# Patient Record
Sex: Male | Born: 1945 | Race: White | Hispanic: No | Marital: Married | Smoking: Never smoker
Health system: Southern US, Community
[De-identification: ages and names within clinical notes are randomized; demographics above are authoritative.]

## PROBLEM LIST (undated history)

## (undated) DIAGNOSIS — E079 Disorder of thyroid, unspecified: Secondary | ICD-10-CM

## (undated) DIAGNOSIS — E7800 Pure hypercholesterolemia, unspecified: Secondary | ICD-10-CM

---

## 2013-02-09 DIAGNOSIS — E782 Mixed hyperlipidemia: Secondary | ICD-10-CM | POA: Diagnosis not present

## 2013-02-09 DIAGNOSIS — Z0000 Encounter for general adult medical examination without abnormal findings: Secondary | ICD-10-CM | POA: Diagnosis not present

## 2013-08-07 DIAGNOSIS — D485 Neoplasm of uncertain behavior of skin: Secondary | ICD-10-CM | POA: Diagnosis not present

## 2013-08-07 DIAGNOSIS — D235 Other benign neoplasm of skin of trunk: Secondary | ICD-10-CM | POA: Diagnosis not present

## 2013-08-07 DIAGNOSIS — L820 Inflamed seborrheic keratosis: Secondary | ICD-10-CM | POA: Diagnosis not present

## 2013-10-19 DIAGNOSIS — Z125 Encounter for screening for malignant neoplasm of prostate: Secondary | ICD-10-CM | POA: Diagnosis not present

## 2013-10-19 DIAGNOSIS — Z0000 Encounter for general adult medical examination without abnormal findings: Secondary | ICD-10-CM | POA: Diagnosis not present

## 2013-10-19 DIAGNOSIS — N400 Enlarged prostate without lower urinary tract symptoms: Secondary | ICD-10-CM | POA: Diagnosis not present

## 2013-10-19 DIAGNOSIS — R7301 Impaired fasting glucose: Secondary | ICD-10-CM | POA: Diagnosis not present

## 2013-10-19 DIAGNOSIS — Z79899 Other long term (current) drug therapy: Secondary | ICD-10-CM | POA: Diagnosis not present

## 2013-10-19 DIAGNOSIS — E782 Mixed hyperlipidemia: Secondary | ICD-10-CM | POA: Diagnosis not present

## 2013-10-19 DIAGNOSIS — E039 Hypothyroidism, unspecified: Secondary | ICD-10-CM | POA: Diagnosis not present

## 2013-10-19 DIAGNOSIS — Z1331 Encounter for screening for depression: Secondary | ICD-10-CM | POA: Diagnosis not present

## 2013-10-19 DIAGNOSIS — Z23 Encounter for immunization: Secondary | ICD-10-CM | POA: Diagnosis not present

## 2013-11-14 DIAGNOSIS — H43819 Vitreous degeneration, unspecified eye: Secondary | ICD-10-CM | POA: Diagnosis not present

## 2013-11-14 DIAGNOSIS — H35379 Puckering of macula, unspecified eye: Secondary | ICD-10-CM | POA: Diagnosis not present

## 2013-11-14 DIAGNOSIS — D3190 Benign neoplasm of unspecified part of unspecified eye: Secondary | ICD-10-CM | POA: Diagnosis not present

## 2013-11-14 DIAGNOSIS — H43399 Other vitreous opacities, unspecified eye: Secondary | ICD-10-CM | POA: Diagnosis not present

## 2013-11-14 DIAGNOSIS — H2510 Age-related nuclear cataract, unspecified eye: Secondary | ICD-10-CM | POA: Diagnosis not present

## 2013-11-14 DIAGNOSIS — G43809 Other migraine, not intractable, without status migrainosus: Secondary | ICD-10-CM | POA: Diagnosis not present

## 2014-11-06 DIAGNOSIS — H35372 Puckering of macula, left eye: Secondary | ICD-10-CM | POA: Diagnosis not present

## 2014-11-06 DIAGNOSIS — H25013 Cortical age-related cataract, bilateral: Secondary | ICD-10-CM | POA: Diagnosis not present

## 2014-11-06 DIAGNOSIS — H2513 Age-related nuclear cataract, bilateral: Secondary | ICD-10-CM | POA: Diagnosis not present

## 2014-11-06 DIAGNOSIS — D3142 Benign neoplasm of left ciliary body: Secondary | ICD-10-CM | POA: Diagnosis not present

## 2014-11-06 DIAGNOSIS — G43809 Other migraine, not intractable, without status migrainosus: Secondary | ICD-10-CM | POA: Diagnosis not present

## 2014-11-14 DIAGNOSIS — Z79899 Other long term (current) drug therapy: Secondary | ICD-10-CM | POA: Diagnosis not present

## 2014-11-14 DIAGNOSIS — Z23 Encounter for immunization: Secondary | ICD-10-CM | POA: Diagnosis not present

## 2014-11-14 DIAGNOSIS — N529 Male erectile dysfunction, unspecified: Secondary | ICD-10-CM | POA: Diagnosis not present

## 2014-11-14 DIAGNOSIS — Z125 Encounter for screening for malignant neoplasm of prostate: Secondary | ICD-10-CM | POA: Diagnosis not present

## 2014-11-14 DIAGNOSIS — E039 Hypothyroidism, unspecified: Secondary | ICD-10-CM | POA: Diagnosis not present

## 2014-11-14 DIAGNOSIS — R8290 Unspecified abnormal findings in urine: Secondary | ICD-10-CM | POA: Diagnosis not present

## 2014-11-14 DIAGNOSIS — R7301 Impaired fasting glucose: Secondary | ICD-10-CM | POA: Diagnosis not present

## 2014-11-14 DIAGNOSIS — E782 Mixed hyperlipidemia: Secondary | ICD-10-CM | POA: Diagnosis not present

## 2014-11-14 DIAGNOSIS — Z0000 Encounter for general adult medical examination without abnormal findings: Secondary | ICD-10-CM | POA: Diagnosis not present

## 2015-02-06 DIAGNOSIS — E039 Hypothyroidism, unspecified: Secondary | ICD-10-CM | POA: Diagnosis not present

## 2015-02-06 DIAGNOSIS — Z23 Encounter for immunization: Secondary | ICD-10-CM | POA: Diagnosis not present

## 2015-02-06 DIAGNOSIS — R7301 Impaired fasting glucose: Secondary | ICD-10-CM | POA: Diagnosis not present

## 2015-03-06 DIAGNOSIS — Z1283 Encounter for screening for malignant neoplasm of skin: Secondary | ICD-10-CM | POA: Diagnosis not present

## 2015-03-06 DIAGNOSIS — L820 Inflamed seborrheic keratosis: Secondary | ICD-10-CM | POA: Diagnosis not present

## 2015-11-12 DIAGNOSIS — G43809 Other migraine, not intractable, without status migrainosus: Secondary | ICD-10-CM | POA: Diagnosis not present

## 2015-11-12 DIAGNOSIS — H35372 Puckering of macula, left eye: Secondary | ICD-10-CM | POA: Diagnosis not present

## 2015-11-12 DIAGNOSIS — H35371 Puckering of macula, right eye: Secondary | ICD-10-CM | POA: Diagnosis not present

## 2015-11-12 DIAGNOSIS — H25013 Cortical age-related cataract, bilateral: Secondary | ICD-10-CM | POA: Diagnosis not present

## 2015-11-12 DIAGNOSIS — H2513 Age-related nuclear cataract, bilateral: Secondary | ICD-10-CM | POA: Diagnosis not present

## 2015-11-12 DIAGNOSIS — D3142 Benign neoplasm of left ciliary body: Secondary | ICD-10-CM | POA: Diagnosis not present

## 2015-11-25 DIAGNOSIS — Z125 Encounter for screening for malignant neoplasm of prostate: Secondary | ICD-10-CM | POA: Diagnosis not present

## 2015-11-25 DIAGNOSIS — R7301 Impaired fasting glucose: Secondary | ICD-10-CM | POA: Diagnosis not present

## 2015-11-25 DIAGNOSIS — E782 Mixed hyperlipidemia: Secondary | ICD-10-CM | POA: Diagnosis not present

## 2015-11-25 DIAGNOSIS — Z0000 Encounter for general adult medical examination without abnormal findings: Secondary | ICD-10-CM | POA: Diagnosis not present

## 2015-11-29 DIAGNOSIS — Z23 Encounter for immunization: Secondary | ICD-10-CM | POA: Diagnosis not present

## 2015-11-29 DIAGNOSIS — Z1389 Encounter for screening for other disorder: Secondary | ICD-10-CM | POA: Diagnosis not present

## 2015-11-29 DIAGNOSIS — E039 Hypothyroidism, unspecified: Secondary | ICD-10-CM | POA: Diagnosis not present

## 2015-11-29 DIAGNOSIS — E782 Mixed hyperlipidemia: Secondary | ICD-10-CM | POA: Diagnosis not present

## 2015-11-29 DIAGNOSIS — R8290 Unspecified abnormal findings in urine: Secondary | ICD-10-CM | POA: Diagnosis not present

## 2015-11-29 DIAGNOSIS — Z0000 Encounter for general adult medical examination without abnormal findings: Secondary | ICD-10-CM | POA: Diagnosis not present

## 2015-11-29 DIAGNOSIS — N400 Enlarged prostate without lower urinary tract symptoms: Secondary | ICD-10-CM | POA: Diagnosis not present

## 2015-11-29 DIAGNOSIS — N529 Male erectile dysfunction, unspecified: Secondary | ICD-10-CM | POA: Diagnosis not present

## 2015-11-29 DIAGNOSIS — Z79899 Other long term (current) drug therapy: Secondary | ICD-10-CM | POA: Diagnosis not present

## 2015-11-29 DIAGNOSIS — R7301 Impaired fasting glucose: Secondary | ICD-10-CM | POA: Diagnosis not present

## 2015-11-29 DIAGNOSIS — H6120 Impacted cerumen, unspecified ear: Secondary | ICD-10-CM | POA: Diagnosis not present

## 2016-02-12 DIAGNOSIS — L820 Inflamed seborrheic keratosis: Secondary | ICD-10-CM | POA: Diagnosis not present

## 2016-02-12 DIAGNOSIS — D225 Melanocytic nevi of trunk: Secondary | ICD-10-CM | POA: Diagnosis not present

## 2016-02-12 DIAGNOSIS — Z1283 Encounter for screening for malignant neoplasm of skin: Secondary | ICD-10-CM | POA: Diagnosis not present

## 2016-02-28 DIAGNOSIS — J069 Acute upper respiratory infection, unspecified: Secondary | ICD-10-CM | POA: Diagnosis not present

## 2016-10-20 DIAGNOSIS — Z23 Encounter for immunization: Secondary | ICD-10-CM | POA: Diagnosis not present

## 2016-11-17 DIAGNOSIS — H35033 Hypertensive retinopathy, bilateral: Secondary | ICD-10-CM | POA: Diagnosis not present

## 2016-11-17 DIAGNOSIS — H35373 Puckering of macula, bilateral: Secondary | ICD-10-CM | POA: Diagnosis not present

## 2016-11-17 DIAGNOSIS — H35371 Puckering of macula, right eye: Secondary | ICD-10-CM | POA: Diagnosis not present

## 2016-11-17 DIAGNOSIS — H25013 Cortical age-related cataract, bilateral: Secondary | ICD-10-CM | POA: Diagnosis not present

## 2016-11-17 DIAGNOSIS — H2513 Age-related nuclear cataract, bilateral: Secondary | ICD-10-CM | POA: Diagnosis not present

## 2016-12-21 DIAGNOSIS — J209 Acute bronchitis, unspecified: Secondary | ICD-10-CM | POA: Diagnosis not present

## 2017-01-01 DIAGNOSIS — E782 Mixed hyperlipidemia: Secondary | ICD-10-CM | POA: Diagnosis not present

## 2017-01-01 DIAGNOSIS — N529 Male erectile dysfunction, unspecified: Secondary | ICD-10-CM | POA: Diagnosis not present

## 2017-01-01 DIAGNOSIS — R7301 Impaired fasting glucose: Secondary | ICD-10-CM | POA: Diagnosis not present

## 2017-01-01 DIAGNOSIS — N400 Enlarged prostate without lower urinary tract symptoms: Secondary | ICD-10-CM | POA: Diagnosis not present

## 2017-01-01 DIAGNOSIS — E039 Hypothyroidism, unspecified: Secondary | ICD-10-CM | POA: Diagnosis not present

## 2017-01-01 DIAGNOSIS — Z125 Encounter for screening for malignant neoplasm of prostate: Secondary | ICD-10-CM | POA: Diagnosis not present

## 2017-01-01 DIAGNOSIS — Z79899 Other long term (current) drug therapy: Secondary | ICD-10-CM | POA: Diagnosis not present

## 2017-01-01 DIAGNOSIS — Z0000 Encounter for general adult medical examination without abnormal findings: Secondary | ICD-10-CM | POA: Diagnosis not present

## 2017-02-10 DIAGNOSIS — L820 Inflamed seborrheic keratosis: Secondary | ICD-10-CM | POA: Diagnosis not present

## 2017-02-10 DIAGNOSIS — Z1283 Encounter for screening for malignant neoplasm of skin: Secondary | ICD-10-CM | POA: Diagnosis not present

## 2017-02-10 DIAGNOSIS — L821 Other seborrheic keratosis: Secondary | ICD-10-CM | POA: Diagnosis not present

## 2017-09-01 DIAGNOSIS — Z23 Encounter for immunization: Secondary | ICD-10-CM | POA: Diagnosis not present

## 2017-09-13 DIAGNOSIS — H903 Sensorineural hearing loss, bilateral: Secondary | ICD-10-CM | POA: Diagnosis not present

## 2017-11-23 DIAGNOSIS — H35033 Hypertensive retinopathy, bilateral: Secondary | ICD-10-CM | POA: Diagnosis not present

## 2017-11-23 DIAGNOSIS — H35371 Puckering of macula, right eye: Secondary | ICD-10-CM | POA: Diagnosis not present

## 2017-11-23 DIAGNOSIS — H25013 Cortical age-related cataract, bilateral: Secondary | ICD-10-CM | POA: Diagnosis not present

## 2017-11-23 DIAGNOSIS — H2513 Age-related nuclear cataract, bilateral: Secondary | ICD-10-CM | POA: Diagnosis not present

## 2017-11-30 DIAGNOSIS — R000 Tachycardia, unspecified: Secondary | ICD-10-CM | POA: Diagnosis not present

## 2017-11-30 DIAGNOSIS — J019 Acute sinusitis, unspecified: Secondary | ICD-10-CM | POA: Diagnosis not present

## 2017-11-30 DIAGNOSIS — R05 Cough: Secondary | ICD-10-CM | POA: Diagnosis not present

## 2017-12-31 DIAGNOSIS — E782 Mixed hyperlipidemia: Secondary | ICD-10-CM | POA: Diagnosis not present

## 2017-12-31 DIAGNOSIS — Z79899 Other long term (current) drug therapy: Secondary | ICD-10-CM | POA: Diagnosis not present

## 2017-12-31 DIAGNOSIS — E039 Hypothyroidism, unspecified: Secondary | ICD-10-CM | POA: Diagnosis not present

## 2017-12-31 DIAGNOSIS — Z125 Encounter for screening for malignant neoplasm of prostate: Secondary | ICD-10-CM | POA: Diagnosis not present

## 2018-01-05 DIAGNOSIS — N529 Male erectile dysfunction, unspecified: Secondary | ICD-10-CM | POA: Diagnosis not present

## 2018-01-05 DIAGNOSIS — N400 Enlarged prostate without lower urinary tract symptoms: Secondary | ICD-10-CM | POA: Diagnosis not present

## 2018-01-05 DIAGNOSIS — R8290 Unspecified abnormal findings in urine: Secondary | ICD-10-CM | POA: Diagnosis not present

## 2018-01-05 DIAGNOSIS — E782 Mixed hyperlipidemia: Secondary | ICD-10-CM | POA: Diagnosis not present

## 2018-01-05 DIAGNOSIS — Z79899 Other long term (current) drug therapy: Secondary | ICD-10-CM | POA: Diagnosis not present

## 2018-01-05 DIAGNOSIS — E039 Hypothyroidism, unspecified: Secondary | ICD-10-CM | POA: Diagnosis not present

## 2018-01-05 DIAGNOSIS — Z125 Encounter for screening for malignant neoplasm of prostate: Secondary | ICD-10-CM | POA: Diagnosis not present

## 2018-01-05 DIAGNOSIS — R7301 Impaired fasting glucose: Secondary | ICD-10-CM | POA: Diagnosis not present

## 2018-01-05 DIAGNOSIS — Z0000 Encounter for general adult medical examination without abnormal findings: Secondary | ICD-10-CM | POA: Diagnosis not present

## 2018-02-14 DIAGNOSIS — E782 Mixed hyperlipidemia: Secondary | ICD-10-CM | POA: Diagnosis not present

## 2018-02-14 DIAGNOSIS — H532 Diplopia: Secondary | ICD-10-CM | POA: Diagnosis not present

## 2018-02-14 DIAGNOSIS — R7301 Impaired fasting glucose: Secondary | ICD-10-CM | POA: Diagnosis not present

## 2018-02-15 ENCOUNTER — Other Ambulatory Visit: Payer: Self-pay | Admitting: Family Medicine

## 2018-02-15 DIAGNOSIS — H532 Diplopia: Secondary | ICD-10-CM

## 2018-02-15 DIAGNOSIS — I639 Cerebral infarction, unspecified: Secondary | ICD-10-CM

## 2018-02-15 DIAGNOSIS — G459 Transient cerebral ischemic attack, unspecified: Secondary | ICD-10-CM

## 2018-02-16 DIAGNOSIS — L821 Other seborrheic keratosis: Secondary | ICD-10-CM | POA: Diagnosis not present

## 2018-02-16 DIAGNOSIS — L820 Inflamed seborrheic keratosis: Secondary | ICD-10-CM | POA: Diagnosis not present

## 2018-02-16 DIAGNOSIS — Z1283 Encounter for screening for malignant neoplasm of skin: Secondary | ICD-10-CM | POA: Diagnosis not present

## 2018-02-18 ENCOUNTER — Ambulatory Visit
Admission: RE | Admit: 2018-02-18 | Discharge: 2018-02-18 | Disposition: A | Discharge status: Home / Self Care | Payer: Medicare Other | Source: Ambulatory Visit | Attending: Family Medicine | Admitting: Family Medicine

## 2018-02-18 DIAGNOSIS — H532 Diplopia: Secondary | ICD-10-CM | POA: Diagnosis not present

## 2018-02-18 DIAGNOSIS — I639 Cerebral infarction, unspecified: Secondary | ICD-10-CM

## 2018-02-18 DIAGNOSIS — G459 Transient cerebral ischemic attack, unspecified: Secondary | ICD-10-CM

## 2018-02-21 ENCOUNTER — Other Ambulatory Visit: Payer: Self-pay | Admitting: Family Medicine

## 2018-02-21 DIAGNOSIS — H532 Diplopia: Secondary | ICD-10-CM

## 2018-03-14 DIAGNOSIS — Z1211 Encounter for screening for malignant neoplasm of colon: Secondary | ICD-10-CM | POA: Diagnosis not present

## 2018-05-02 DIAGNOSIS — H903 Sensorineural hearing loss, bilateral: Secondary | ICD-10-CM | POA: Diagnosis not present

## 2018-10-17 DIAGNOSIS — Z23 Encounter for immunization: Secondary | ICD-10-CM | POA: Diagnosis not present

## 2018-12-02 DIAGNOSIS — D3142 Benign neoplasm of left ciliary body: Secondary | ICD-10-CM | POA: Diagnosis not present

## 2018-12-02 DIAGNOSIS — H35372 Puckering of macula, left eye: Secondary | ICD-10-CM | POA: Diagnosis not present

## 2018-12-02 DIAGNOSIS — H2513 Age-related nuclear cataract, bilateral: Secondary | ICD-10-CM | POA: Diagnosis not present

## 2018-12-02 DIAGNOSIS — H35033 Hypertensive retinopathy, bilateral: Secondary | ICD-10-CM | POA: Diagnosis not present

## 2018-12-02 DIAGNOSIS — H25013 Cortical age-related cataract, bilateral: Secondary | ICD-10-CM | POA: Diagnosis not present

## 2018-12-02 DIAGNOSIS — H35371 Puckering of macula, right eye: Secondary | ICD-10-CM | POA: Diagnosis not present

## 2019-01-17 DIAGNOSIS — Z79899 Other long term (current) drug therapy: Secondary | ICD-10-CM | POA: Diagnosis not present

## 2019-01-17 DIAGNOSIS — Z125 Encounter for screening for malignant neoplasm of prostate: Secondary | ICD-10-CM | POA: Diagnosis not present

## 2019-01-17 DIAGNOSIS — Z1159 Encounter for screening for other viral diseases: Secondary | ICD-10-CM | POA: Diagnosis not present

## 2019-01-17 DIAGNOSIS — E782 Mixed hyperlipidemia: Secondary | ICD-10-CM | POA: Diagnosis not present

## 2019-01-17 DIAGNOSIS — R7301 Impaired fasting glucose: Secondary | ICD-10-CM | POA: Diagnosis not present

## 2019-01-20 DIAGNOSIS — G43809 Other migraine, not intractable, without status migrainosus: Secondary | ICD-10-CM | POA: Diagnosis not present

## 2019-01-20 DIAGNOSIS — Z0000 Encounter for general adult medical examination without abnormal findings: Secondary | ICD-10-CM | POA: Diagnosis not present

## 2019-01-20 DIAGNOSIS — R7301 Impaired fasting glucose: Secondary | ICD-10-CM | POA: Diagnosis not present

## 2019-01-20 DIAGNOSIS — N529 Male erectile dysfunction, unspecified: Secondary | ICD-10-CM | POA: Diagnosis not present

## 2019-01-20 DIAGNOSIS — Z1389 Encounter for screening for other disorder: Secondary | ICD-10-CM | POA: Diagnosis not present

## 2019-01-20 DIAGNOSIS — N400 Enlarged prostate without lower urinary tract symptoms: Secondary | ICD-10-CM | POA: Diagnosis not present

## 2019-01-20 DIAGNOSIS — Z79899 Other long term (current) drug therapy: Secondary | ICD-10-CM | POA: Diagnosis not present

## 2019-01-20 DIAGNOSIS — H6120 Impacted cerumen, unspecified ear: Secondary | ICD-10-CM | POA: Diagnosis not present

## 2019-01-20 DIAGNOSIS — E782 Mixed hyperlipidemia: Secondary | ICD-10-CM | POA: Diagnosis not present

## 2019-01-20 DIAGNOSIS — Z125 Encounter for screening for malignant neoplasm of prostate: Secondary | ICD-10-CM | POA: Diagnosis not present

## 2019-01-20 DIAGNOSIS — E039 Hypothyroidism, unspecified: Secondary | ICD-10-CM | POA: Diagnosis not present

## 2019-01-20 DIAGNOSIS — Z1159 Encounter for screening for other viral diseases: Secondary | ICD-10-CM | POA: Diagnosis not present

## 2019-01-20 DIAGNOSIS — R8290 Unspecified abnormal findings in urine: Secondary | ICD-10-CM | POA: Diagnosis not present

## 2019-01-30 DIAGNOSIS — D225 Melanocytic nevi of trunk: Secondary | ICD-10-CM | POA: Diagnosis not present

## 2019-01-30 DIAGNOSIS — Z1283 Encounter for screening for malignant neoplasm of skin: Secondary | ICD-10-CM | POA: Diagnosis not present

## 2019-01-30 DIAGNOSIS — L820 Inflamed seborrheic keratosis: Secondary | ICD-10-CM | POA: Diagnosis not present

## 2019-07-04 DIAGNOSIS — H6120 Impacted cerumen, unspecified ear: Secondary | ICD-10-CM | POA: Diagnosis not present

## 2019-07-04 DIAGNOSIS — R7301 Impaired fasting glucose: Secondary | ICD-10-CM | POA: Diagnosis not present

## 2019-07-04 DIAGNOSIS — Z1159 Encounter for screening for other viral diseases: Secondary | ICD-10-CM | POA: Diagnosis not present

## 2019-07-04 DIAGNOSIS — Z125 Encounter for screening for malignant neoplasm of prostate: Secondary | ICD-10-CM | POA: Diagnosis not present

## 2019-07-04 DIAGNOSIS — E039 Hypothyroidism, unspecified: Secondary | ICD-10-CM | POA: Diagnosis not present

## 2019-07-04 DIAGNOSIS — N529 Male erectile dysfunction, unspecified: Secondary | ICD-10-CM | POA: Diagnosis not present

## 2019-07-04 DIAGNOSIS — Z79899 Other long term (current) drug therapy: Secondary | ICD-10-CM | POA: Diagnosis not present

## 2019-07-04 DIAGNOSIS — N400 Enlarged prostate without lower urinary tract symptoms: Secondary | ICD-10-CM | POA: Diagnosis not present

## 2019-07-04 DIAGNOSIS — R8290 Unspecified abnormal findings in urine: Secondary | ICD-10-CM | POA: Diagnosis not present

## 2019-07-04 DIAGNOSIS — E782 Mixed hyperlipidemia: Secondary | ICD-10-CM | POA: Diagnosis not present

## 2019-07-04 DIAGNOSIS — G43809 Other migraine, not intractable, without status migrainosus: Secondary | ICD-10-CM | POA: Diagnosis not present

## 2019-09-21 DIAGNOSIS — Z23 Encounter for immunization: Secondary | ICD-10-CM | POA: Diagnosis not present

## 2020-01-22 DIAGNOSIS — E782 Mixed hyperlipidemia: Secondary | ICD-10-CM | POA: Diagnosis not present

## 2020-01-22 DIAGNOSIS — E039 Hypothyroidism, unspecified: Secondary | ICD-10-CM | POA: Diagnosis not present

## 2020-01-22 DIAGNOSIS — Z79899 Other long term (current) drug therapy: Secondary | ICD-10-CM | POA: Diagnosis not present

## 2020-01-22 DIAGNOSIS — Z125 Encounter for screening for malignant neoplasm of prostate: Secondary | ICD-10-CM | POA: Diagnosis not present

## 2020-01-22 DIAGNOSIS — R7301 Impaired fasting glucose: Secondary | ICD-10-CM | POA: Diagnosis not present

## 2020-01-25 DIAGNOSIS — N400 Enlarged prostate without lower urinary tract symptoms: Secondary | ICD-10-CM | POA: Diagnosis not present

## 2020-01-25 DIAGNOSIS — R7301 Impaired fasting glucose: Secondary | ICD-10-CM | POA: Diagnosis not present

## 2020-01-25 DIAGNOSIS — Z0000 Encounter for general adult medical examination without abnormal findings: Secondary | ICD-10-CM | POA: Diagnosis not present

## 2020-01-25 DIAGNOSIS — Z79899 Other long term (current) drug therapy: Secondary | ICD-10-CM | POA: Diagnosis not present

## 2020-01-25 DIAGNOSIS — N529 Male erectile dysfunction, unspecified: Secondary | ICD-10-CM | POA: Diagnosis not present

## 2020-01-25 DIAGNOSIS — Z125 Encounter for screening for malignant neoplasm of prostate: Secondary | ICD-10-CM | POA: Diagnosis not present

## 2020-01-25 DIAGNOSIS — E039 Hypothyroidism, unspecified: Secondary | ICD-10-CM | POA: Diagnosis not present

## 2020-01-25 DIAGNOSIS — E782 Mixed hyperlipidemia: Secondary | ICD-10-CM | POA: Diagnosis not present

## 2020-01-31 DIAGNOSIS — L821 Other seborrheic keratosis: Secondary | ICD-10-CM | POA: Diagnosis not present

## 2020-01-31 DIAGNOSIS — Z1283 Encounter for screening for malignant neoplasm of skin: Secondary | ICD-10-CM | POA: Diagnosis not present

## 2020-02-04 ENCOUNTER — Ambulatory Visit: Payer: Medicare Other | Attending: Internal Medicine

## 2020-02-04 DIAGNOSIS — Z23 Encounter for immunization: Secondary | ICD-10-CM

## 2020-02-04 NOTE — Progress Notes (Signed)
   Covid-19 Vaccination Clinic  Name:  Sidhant Kult    MRN: [id number] DOB: [birth year]  02/04/2020  Mr. Barrilleaux was observed post Covid-19 immunization for 15 minutes without incidence. He was provided with Vaccine Information Sheet and instruction to access the V-Safe system.   Mr. Sheets was instructed to call 911 with any severe reactions post vaccine: Marland Kitchen Difficulty breathing  . Swelling of your face and throat  . A fast heartbeat  . A bad rash all over your body  . Dizziness and weakness    Immunizations Administered    Name Date Dose VIS Date Route   Pfizer COVID-19 Vaccine 02/04/2020 11:08 AM 0.3 mL 12/01/2019 Intramuscular   Manufacturer: [place]   Lot: [id number]   [place]: [id number]

## 2020-02-27 ENCOUNTER — Ambulatory Visit: Payer: Medicare Other | Attending: Internal Medicine

## 2020-02-27 DIAGNOSIS — Z23 Encounter for immunization: Secondary | ICD-10-CM | POA: Insufficient documentation

## 2020-02-27 NOTE — Progress Notes (Signed)
   Covid-19 Vaccination Clinic  Name:  Tanner Valenzuela    MRN: [id number] DOB: 07/02/1946  02/27/2020  [name] was observed post Covid-19 immunization for 15 minutes without incident. He was provided with Vaccine Information Sheet and instruction to access the V-Safe system.   [name] was instructed to call 911 with any severe reactions post vaccine: Marland Kitchen Difficulty breathing  . Swelling of face and throat  . A fast heartbeat  . A bad rash all over body  . Dizziness and weakness   Immunizations Administered    Name Date Dose VIS Date Route   Pfizer COVID-19 Vaccine 02/27/2020  1:08 PM 0.3 mL 12/01/2019 Intramuscular   Manufacturer: [place]   Lot: [id number]   [place]: [id number]

## 2020-07-25 DIAGNOSIS — E782 Mixed hyperlipidemia: Secondary | ICD-10-CM | POA: Diagnosis not present

## 2020-07-25 DIAGNOSIS — N400 Enlarged prostate without lower urinary tract symptoms: Secondary | ICD-10-CM | POA: Diagnosis not present

## 2020-07-25 DIAGNOSIS — R7301 Impaired fasting glucose: Secondary | ICD-10-CM | POA: Diagnosis not present

## 2020-07-25 DIAGNOSIS — E039 Hypothyroidism, unspecified: Secondary | ICD-10-CM | POA: Diagnosis not present

## 2020-07-25 DIAGNOSIS — Z0000 Encounter for general adult medical examination without abnormal findings: Secondary | ICD-10-CM | POA: Diagnosis not present

## 2020-07-25 DIAGNOSIS — Z131 Encounter for screening for diabetes mellitus: Secondary | ICD-10-CM | POA: Diagnosis not present

## 2020-07-25 DIAGNOSIS — N529 Male erectile dysfunction, unspecified: Secondary | ICD-10-CM | POA: Diagnosis not present

## 2020-07-25 DIAGNOSIS — Z79899 Other long term (current) drug therapy: Secondary | ICD-10-CM | POA: Diagnosis not present

## 2020-07-25 DIAGNOSIS — Z125 Encounter for screening for malignant neoplasm of prostate: Secondary | ICD-10-CM | POA: Diagnosis not present

## 2020-10-04 DIAGNOSIS — Z23 Encounter for immunization: Secondary | ICD-10-CM | POA: Diagnosis not present

## 2020-12-10 DIAGNOSIS — H35033 Hypertensive retinopathy, bilateral: Secondary | ICD-10-CM | POA: Diagnosis not present

## 2020-12-10 DIAGNOSIS — H25013 Cortical age-related cataract, bilateral: Secondary | ICD-10-CM | POA: Diagnosis not present

## 2020-12-10 DIAGNOSIS — H35373 Puckering of macula, bilateral: Secondary | ICD-10-CM | POA: Diagnosis not present

## 2020-12-10 DIAGNOSIS — H2513 Age-related nuclear cataract, bilateral: Secondary | ICD-10-CM | POA: Diagnosis not present

## 2021-02-05 DIAGNOSIS — D225 Melanocytic nevi of trunk: Secondary | ICD-10-CM | POA: Diagnosis not present

## 2021-02-05 DIAGNOSIS — Z1283 Encounter for screening for malignant neoplasm of skin: Secondary | ICD-10-CM | POA: Diagnosis not present

## 2021-02-05 DIAGNOSIS — L820 Inflamed seborrheic keratosis: Secondary | ICD-10-CM | POA: Diagnosis not present

## 2021-04-10 DIAGNOSIS — Z23 Encounter for immunization: Secondary | ICD-10-CM | POA: Diagnosis not present

## 2021-04-10 DIAGNOSIS — E782 Mixed hyperlipidemia: Secondary | ICD-10-CM | POA: Diagnosis not present

## 2021-04-10 DIAGNOSIS — R7303 Prediabetes: Secondary | ICD-10-CM | POA: Diagnosis not present

## 2021-04-10 DIAGNOSIS — E039 Hypothyroidism, unspecified: Secondary | ICD-10-CM | POA: Diagnosis not present

## 2021-04-10 DIAGNOSIS — Z0000 Encounter for general adult medical examination without abnormal findings: Secondary | ICD-10-CM | POA: Diagnosis not present

## 2021-04-10 DIAGNOSIS — N529 Male erectile dysfunction, unspecified: Secondary | ICD-10-CM | POA: Diagnosis not present

## 2021-09-26 DIAGNOSIS — Z23 Encounter for immunization: Secondary | ICD-10-CM | POA: Diagnosis not present

## 2021-12-11 DIAGNOSIS — D3192 Benign neoplasm of unspecified part of left eye: Secondary | ICD-10-CM | POA: Diagnosis not present

## 2021-12-11 DIAGNOSIS — H35031 Hypertensive retinopathy, right eye: Secondary | ICD-10-CM | POA: Diagnosis not present

## 2021-12-11 DIAGNOSIS — H25813 Combined forms of age-related cataract, bilateral: Secondary | ICD-10-CM | POA: Diagnosis not present

## 2021-12-11 DIAGNOSIS — G43B0 Ophthalmoplegic migraine, not intractable: Secondary | ICD-10-CM | POA: Diagnosis not present

## 2022-01-07 DIAGNOSIS — R52 Pain, unspecified: Secondary | ICD-10-CM | POA: Diagnosis not present

## 2022-01-07 DIAGNOSIS — R030 Elevated blood-pressure reading, without diagnosis of hypertension: Secondary | ICD-10-CM | POA: Diagnosis not present

## 2022-01-07 DIAGNOSIS — Z20822 Contact with and (suspected) exposure to covid-19: Secondary | ICD-10-CM | POA: Diagnosis not present

## 2022-01-07 DIAGNOSIS — U071 COVID-19: Secondary | ICD-10-CM | POA: Diagnosis not present

## 2022-01-07 DIAGNOSIS — R059 Cough, unspecified: Secondary | ICD-10-CM | POA: Diagnosis not present

## 2022-01-07 DIAGNOSIS — J069 Acute upper respiratory infection, unspecified: Secondary | ICD-10-CM | POA: Diagnosis not present

## 2022-01-07 DIAGNOSIS — R5383 Other fatigue: Secondary | ICD-10-CM | POA: Diagnosis not present

## 2022-01-22 ENCOUNTER — Other Ambulatory Visit: Payer: Self-pay

## 2022-01-22 ENCOUNTER — Encounter (HOSPITAL_BASED_OUTPATIENT_CLINIC_OR_DEPARTMENT_OTHER): Payer: Self-pay | Admitting: *Deleted

## 2022-01-22 ENCOUNTER — Emergency Department (HOSPITAL_BASED_OUTPATIENT_CLINIC_OR_DEPARTMENT_OTHER)
Admission: EM | Admit: 2022-01-22 | Discharge: 2022-01-23 | Disposition: A | Discharge status: Home / Self Care | Payer: Medicare Other | Attending: Emergency Medicine | Admitting: Emergency Medicine

## 2022-01-22 ENCOUNTER — Emergency Department (HOSPITAL_BASED_OUTPATIENT_CLINIC_OR_DEPARTMENT_OTHER): Payer: Medicare Other

## 2022-01-22 DIAGNOSIS — X58XXXA Exposure to other specified factors, initial encounter: Secondary | ICD-10-CM | POA: Insufficient documentation

## 2022-01-22 DIAGNOSIS — T185XXA Foreign body in anus and rectum, initial encounter: Secondary | ICD-10-CM | POA: Diagnosis not present

## 2022-01-22 DIAGNOSIS — Z189 Retained foreign body fragments, unspecified material: Secondary | ICD-10-CM

## 2022-01-22 HISTORY — DX: Disorder of thyroid, unspecified: E07.9

## 2022-01-22 HISTORY — DX: Pure hypercholesterolemia, unspecified: E78.00

## 2022-01-22 NOTE — ED Triage Notes (Signed)
He has a dildo in his rectum.

## 2022-01-22 NOTE — Discharge Instructions (Addendum)
You may have some Rectal swelling, discomfort and you may notice some mild bleeding with BMs. Use ice packs over a towel for swelling and discomfort applied to your rectal area. Use warm sits baths. Return immediately for heavy bleeding, severe rectal or abdominal pain, or fever.

## 2022-01-22 NOTE — ED Provider Notes (Signed)
[facility] EMERGENCY DEPARTMENT Provider Note   CSN: [id number] Arrival date & time: 01/22/22  2050     History  No chief complaint on file.   Tanner Valenzuela is a 76 y.o. male who presents to the ED today with complaint of retained FB. Pt reports that he has an approximately 6 inch rubber dildo lodged in his rectum that got stuck around 6:15 PM tonight. Pt attempted removal without success prompting ED visit. He denies any pain currently. No other complaints at this time.   The history is provided by the patient and medical records.      Home Medications Prior to Admission medications   Medication Sig Start Date End Date Taking? Authorizing Provider  levothyroxine (SYNTHROID) 100 MCG tablet take 1 tablet by mouth every day in the morning on empty stomach    [provider]  pravastatin (PRAVACHOL) 10 MG tablet 1 tablet    [provider]  sildenafil (REVATIO) 20 MG tablet Take 2-5 tablets    [provider]      Allergies    Patient has no known allergies.    Review of Systems   Review of Systems  Constitutional:  Negative for chills and fever.  Gastrointestinal:  Negative for abdominal pain, nausea and vomiting.  Genitourinary:        + FB  All other systems reviewed and are negative.  Physical Exam Updated Vital Signs BP (!) 176/96 (BP Location: Right Arm)    Pulse (!) 126    Temp 99.8 F (37.7 C) (Oral)    Resp 18    Ht 5\' 10"  (1.778 m)    Wt 79.4 kg    SpO2 95%    BMI 25.11 kg/m  Physical Exam Vitals and nursing note reviewed.  Constitutional:      Appearance: He is not ill-appearing.  HENT:     Head: Normocephalic and atraumatic.  Eyes:     Conjunctiva/sclera: Conjunctivae normal.  Cardiovascular:     Rate and Rhythm: Normal rate and regular rhythm.  Pulmonary:     Effort: Pulmonary effort is normal.     Breath sounds: Normal breath sounds.  Abdominal:     Palpations: Abdomen is soft.     Tenderness: There is no  abdominal tenderness.  Genitourinary:    Comments: Chaperone present for GU exam. Able to touch the distal end of the foreign body in vault however unable to grab onto FB successfully for removal.  Musculoskeletal:     Cervical back: Neck supple.  Skin:    General: Skin is warm and dry.  Neurological:     Mental Status: He is alert.    ED Results / Procedures / Treatments   Labs (all labs ordered are listed, but only abnormal results are displayed) Labs Reviewed - No data to display   EKG None  Radiology DG Pelvis Portable  Result Date: 01/22/2022 CLINICAL DATA:  foreign body. " dildo in his rectum." EXAM: PORTABLE PELVIS 1-2 VIEWS COMPARISON:  None. FINDINGS: There is no evidence of pelvic fracture or diastasis. No pelvic bone lesions are seen. No definite retained radiopaque foreign body. There is a vague density overlying the sacrum with overlying tubular-like lucency of unclear origin. IMPRESSION: No definite retained radiopaque foreign body. Electronically Signed   By: Iven Finn M.D.   On: 01/22/2022 22:03    Procedures Procedures    Medications Ordered in ED Medications - No data to display  ED Course/ Medical Decision Making/ A&P  Medical Decision Making 76 year old male who presents to the ED today for retained foreign body.  Has had 6 inch dildo inside rectum since about 6:15 PM tonight.  No complaints of pain.  On arrival to the ED patient is afebrile.  He is tachycardic in the 120s however admits that he is embarrassed.  He has no abdominal tenderness palpation on exam.  GU exam performed with chaperone at bedside.  Able to touch the distal end of the rubber foreign body however unable to grab on to remove item.  Pelvic x-ray obtained without any visualized foreign body however again able to palpate this.  Have discussed case with gastroenterologist Dr. Alessandra Bevels who will plan for scope in a.m.  Recommends ED to ED transfer to [facility]  and whenever patient arrives to the facility to directly message him.  His wife drove him to the ER tonight.  She will drive him to [facility].  Have discussed case with Dr. Eulis Foster who is excepting patient at this time. Labs not collected at this time and can be collected at [place] as needed.   Amount and/or Complexity of Data Reviewed Labs: ordered. Radiology: ordered.          Final Clinical Impression(s) / ED Diagnoses Final diagnoses:  Retained foreign body    Rx / DC Orders ED Discharge Orders     None        Discharge Instructions      Please go directly to [facility] Emergency Department with plans for the gastroenterologist to see you in the morning to scope/removal of the foreign body       Eustaquio Maize, PA-C 01/22/22 [place], Ankit, MD 01/24/22 1424

## 2022-01-23 DIAGNOSIS — T185XXA Foreign body in anus and rectum, initial encounter: Secondary | ICD-10-CM | POA: Diagnosis not present

## 2022-01-23 MED ORDER — PROPOFOL 10 MG/ML IV BOLUS
1.0000 mg/kg | Freq: Once | INTRAVENOUS | Status: AC
  Administered 2022-01-23: 80 mg via INTRAVENOUS
  Filled 2022-01-23: qty 20

## 2022-01-23 NOTE — Progress Notes (Signed)
Respiratory therapist present for the duration of sedation procedure.  Pt remained stable and comfortable throughout the procedure.

## 2022-01-23 NOTE — ED Provider Notes (Signed)
.  Sedation  Date/Time: 01/23/2022 1:25 AM Performed by: Maudie Flakes, MD Authorized by: Maudie Flakes, MD   Consent:    Consent obtained:  Verbal and written   Consent given by:  Patient   Risks discussed:  Allergic reaction, dysrhythmia, inadequate sedation, nausea, vomiting, respiratory compromise necessitating ventilatory assistance and intubation and prolonged hypoxia resulting in organ damage Universal protocol:    Immediately prior to procedure, a time out was called: yes     Patient identity confirmed:  Verbally with patient and arm band Indications:    Procedure performed:  Foreign body removal   Procedure necessitating sedation performed by:  Physician performing sedation Pre-sedation assessment:    Time since last food or drink:  4 hours   ASA classification: class 1 - normal, healthy patient     Mouth opening:  3 or more finger widths   Mallampati score:  I - soft palate, uvula, fauces, pillars visible   Neck mobility: normal     Pre-sedation assessments completed and reviewed: airway patency, cardiovascular function, hydration status, mental status, nausea/vomiting, pain level, respiratory function and temperature   Immediate pre-procedure details:    Reassessment: Patient reassessed immediately prior to procedure     Reviewed: vital signs     Verified: bag valve mask available, emergency equipment available, intubation equipment available, IV patency confirmed, oxygen available and suction available   Procedure details (see MAR for exact dosages):    Preoxygenation:  Nasal cannula   Sedation:  Propofol   Intended level of sedation: deep   Intra-procedure monitoring:  Blood pressure monitoring, cardiac monitor, continuous pulse oximetry, continuous capnometry, frequent LOC assessments and frequent vital sign checks   Intra-procedure events: none     Total Provider sedation time (minutes):  25 Post-procedure details:    Attendance: Constant attendance by certified  staff until patient recovered     Recovery: Patient returned to pre-procedure baseline     Post-sedation assessments completed and reviewed: airway patency, cardiovascular function, hydration status, mental status, nausea/vomiting, pain level, respiratory function and temperature     Patient is stable for discharge or admission: yes     Procedure completion:  Tolerated well, no immediate complications Comments:     Sedated in the prone position for rectal foreign body removal.  80 mg propofol used, no complications.  See separate note by Margarita Mail for details of foreign body removal.  I was present and assisting for the entire procedure.    Maudie Flakes, MD 01/23/22 [phone]

## 2022-01-23 NOTE — ED Provider Notes (Addendum)
Patient sent from [facility] for rectal foreign body.  Manual removal attempted and failed.  Patient sent here for removal by gastroenterology.  Dr. Alessandra Bevels apparently is planning for removal in the morning.  I discussed this with the patient.  He is willing to undergo procedural sedation and have [place] try to remove the rectal foreign body here in the emergency department.  I reviewed all risks and benefits of the procedure including potential for perforation, risk and benefits of drugs used during procedural sedation and patient is willing to consent.   .Foreign Body Removal  Date/Time: 01/23/2022 1:39 AM Performed by: Margarita Mail, PA-C Authorized by: Margarita Mail, PA-C  Consent: Written consent obtained. Risks and benefits: risks, benefits and alternatives were discussed Consent given by: patient Patient understanding: patient states understanding of the procedure being performed Patient consent: the patient's understanding of the procedure matches consent given Imaging studies: imaging studies available Patient identity confirmed: arm band and provided demographic data Time out: Immediately prior to procedure a "time out" was called to verify the correct patient, procedure, equipment, support staff and site/side marked as required. Body area: rectum  Sedation: Patient sedated: yes (Please see associated conscious sedation note by Dr. Sedonia Small) Sedatives: propofol  Localization method: speculum, probed and visualized Removal mechanism: ring forceps Complexity: simple 1 objects recovered. Objects recovered: 10 inch dildo Post-procedure assessment: foreign body removed Patient tolerance: patient tolerated the procedure well with no immediate complications     Margarita Mail, PA-C 01/23/22 0141    Margarita Mail, PA-C 01/23/22 0210    Maudie Flakes, MD 01/23/22 [phone]

## 2022-02-11 DIAGNOSIS — Z1283 Encounter for screening for malignant neoplasm of skin: Secondary | ICD-10-CM | POA: Diagnosis not present

## 2022-02-11 DIAGNOSIS — D225 Melanocytic nevi of trunk: Secondary | ICD-10-CM | POA: Diagnosis not present

## 2022-03-24 DIAGNOSIS — M25569 Pain in unspecified knee: Secondary | ICD-10-CM | POA: Diagnosis not present

## 2022-03-24 DIAGNOSIS — E782 Mixed hyperlipidemia: Secondary | ICD-10-CM | POA: Diagnosis not present

## 2022-03-24 DIAGNOSIS — R7301 Impaired fasting glucose: Secondary | ICD-10-CM | POA: Diagnosis not present

## 2022-03-24 DIAGNOSIS — E1169 Type 2 diabetes mellitus with other specified complication: Secondary | ICD-10-CM | POA: Diagnosis not present

## 2022-03-24 DIAGNOSIS — Z0000 Encounter for general adult medical examination without abnormal findings: Secondary | ICD-10-CM | POA: Diagnosis not present

## 2022-03-24 DIAGNOSIS — R030 Elevated blood-pressure reading, without diagnosis of hypertension: Secondary | ICD-10-CM | POA: Diagnosis not present

## 2022-03-24 DIAGNOSIS — E039 Hypothyroidism, unspecified: Secondary | ICD-10-CM | POA: Diagnosis not present

## 2022-04-22 DIAGNOSIS — E1169 Type 2 diabetes mellitus with other specified complication: Secondary | ICD-10-CM | POA: Diagnosis not present

## 2022-04-22 DIAGNOSIS — E782 Mixed hyperlipidemia: Secondary | ICD-10-CM | POA: Diagnosis not present

## 2022-04-22 DIAGNOSIS — I10 Essential (primary) hypertension: Secondary | ICD-10-CM | POA: Diagnosis not present

## 2022-07-29 DIAGNOSIS — E1169 Type 2 diabetes mellitus with other specified complication: Secondary | ICD-10-CM | POA: Diagnosis not present

## 2022-07-29 DIAGNOSIS — N529 Male erectile dysfunction, unspecified: Secondary | ICD-10-CM | POA: Diagnosis not present

## 2022-07-29 DIAGNOSIS — E782 Mixed hyperlipidemia: Secondary | ICD-10-CM | POA: Diagnosis not present

## 2022-07-29 DIAGNOSIS — I10 Essential (primary) hypertension: Secondary | ICD-10-CM | POA: Diagnosis not present

## 2022-09-02 DIAGNOSIS — D3192 Benign neoplasm of unspecified part of left eye: Secondary | ICD-10-CM | POA: Diagnosis not present

## 2022-09-02 DIAGNOSIS — H35031 Hypertensive retinopathy, right eye: Secondary | ICD-10-CM | POA: Diagnosis not present

## 2022-09-02 DIAGNOSIS — H25813 Combined forms of age-related cataract, bilateral: Secondary | ICD-10-CM | POA: Diagnosis not present

## 2022-09-02 DIAGNOSIS — H43813 Vitreous degeneration, bilateral: Secondary | ICD-10-CM | POA: Diagnosis not present

## 2022-09-02 DIAGNOSIS — H25811 Combined forms of age-related cataract, right eye: Secondary | ICD-10-CM | POA: Diagnosis not present

## 2022-09-14 DIAGNOSIS — E039 Hypothyroidism, unspecified: Secondary | ICD-10-CM | POA: Diagnosis not present

## 2022-09-14 DIAGNOSIS — H903 Sensorineural hearing loss, bilateral: Secondary | ICD-10-CM | POA: Diagnosis not present

## 2022-09-17 DIAGNOSIS — Z23 Encounter for immunization: Secondary | ICD-10-CM | POA: Diagnosis not present

## 2022-10-06 DIAGNOSIS — Z23 Encounter for immunization: Secondary | ICD-10-CM | POA: Diagnosis not present

## 2022-11-04 DIAGNOSIS — H2511 Age-related nuclear cataract, right eye: Secondary | ICD-10-CM | POA: Diagnosis not present

## 2022-11-04 DIAGNOSIS — H268 Other specified cataract: Secondary | ICD-10-CM | POA: Diagnosis not present

## 2022-11-30 DIAGNOSIS — H25812 Combined forms of age-related cataract, left eye: Secondary | ICD-10-CM | POA: Diagnosis not present

## 2022-12-09 DIAGNOSIS — H25812 Combined forms of age-related cataract, left eye: Secondary | ICD-10-CM | POA: Diagnosis not present

## 2023-09-19 IMAGING — DX DG PORTABLE PELVIS
2 series · 2 of 2 positions shown · non-contrast
Comparison: None.

CLINICAL DATA: foreign body. " dildo in his rectum."

EXAM:
PORTABLE PELVIS 1-2 VIEWS

[pelvis ap (1 of 2)]
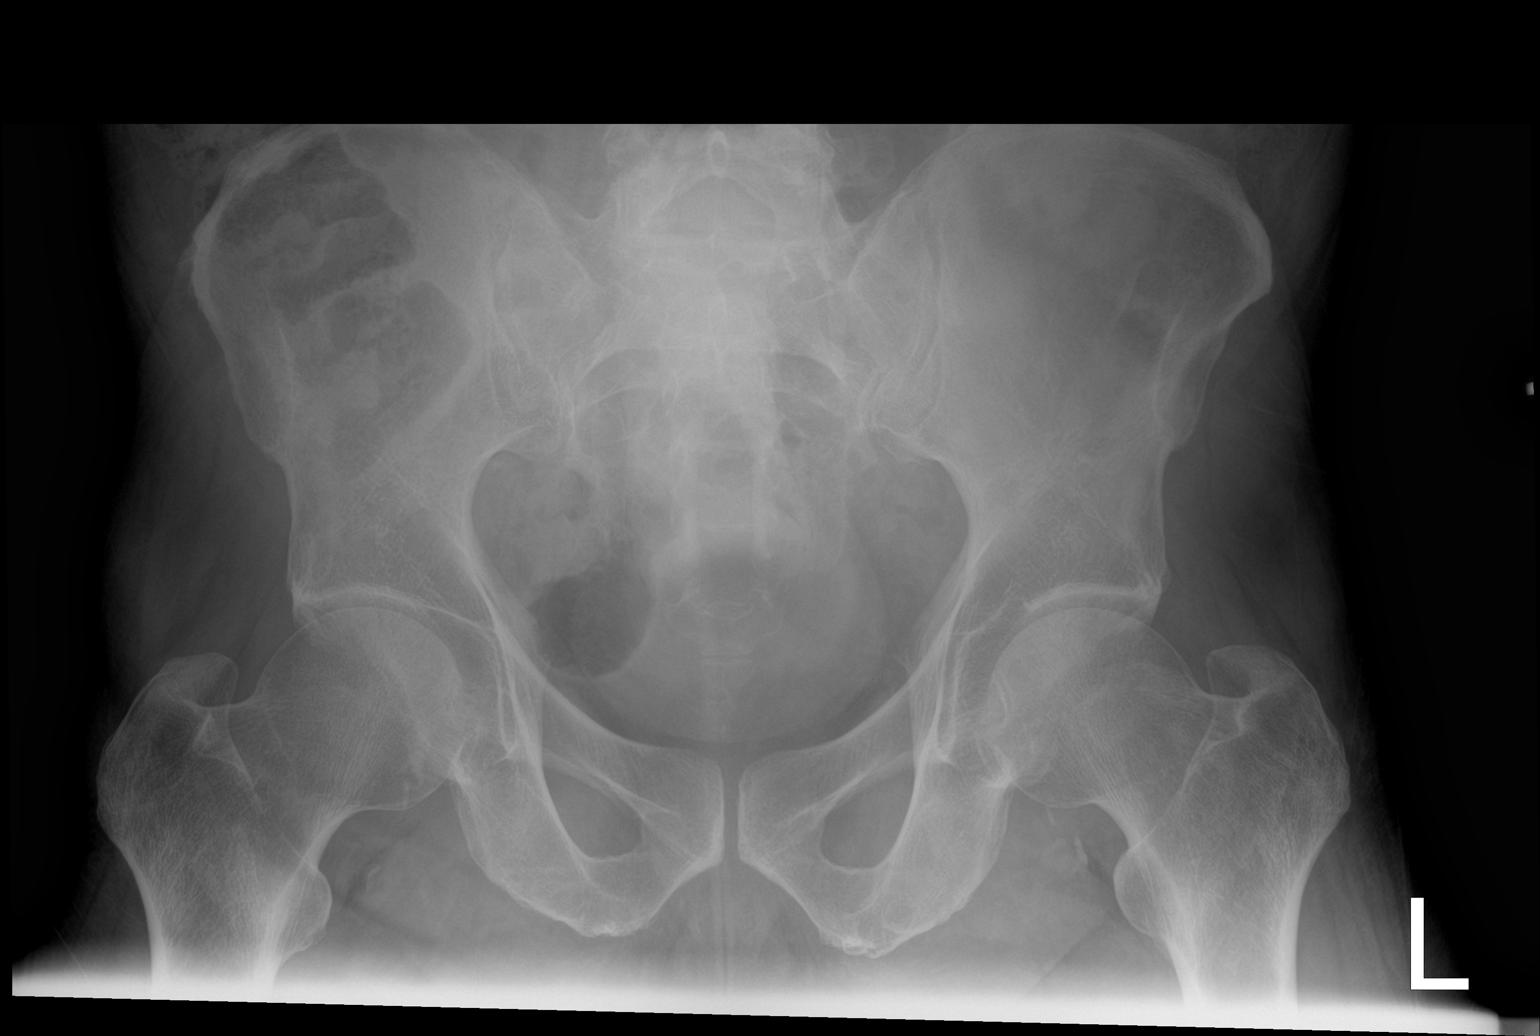

[pelvis ap (2 of 2)]
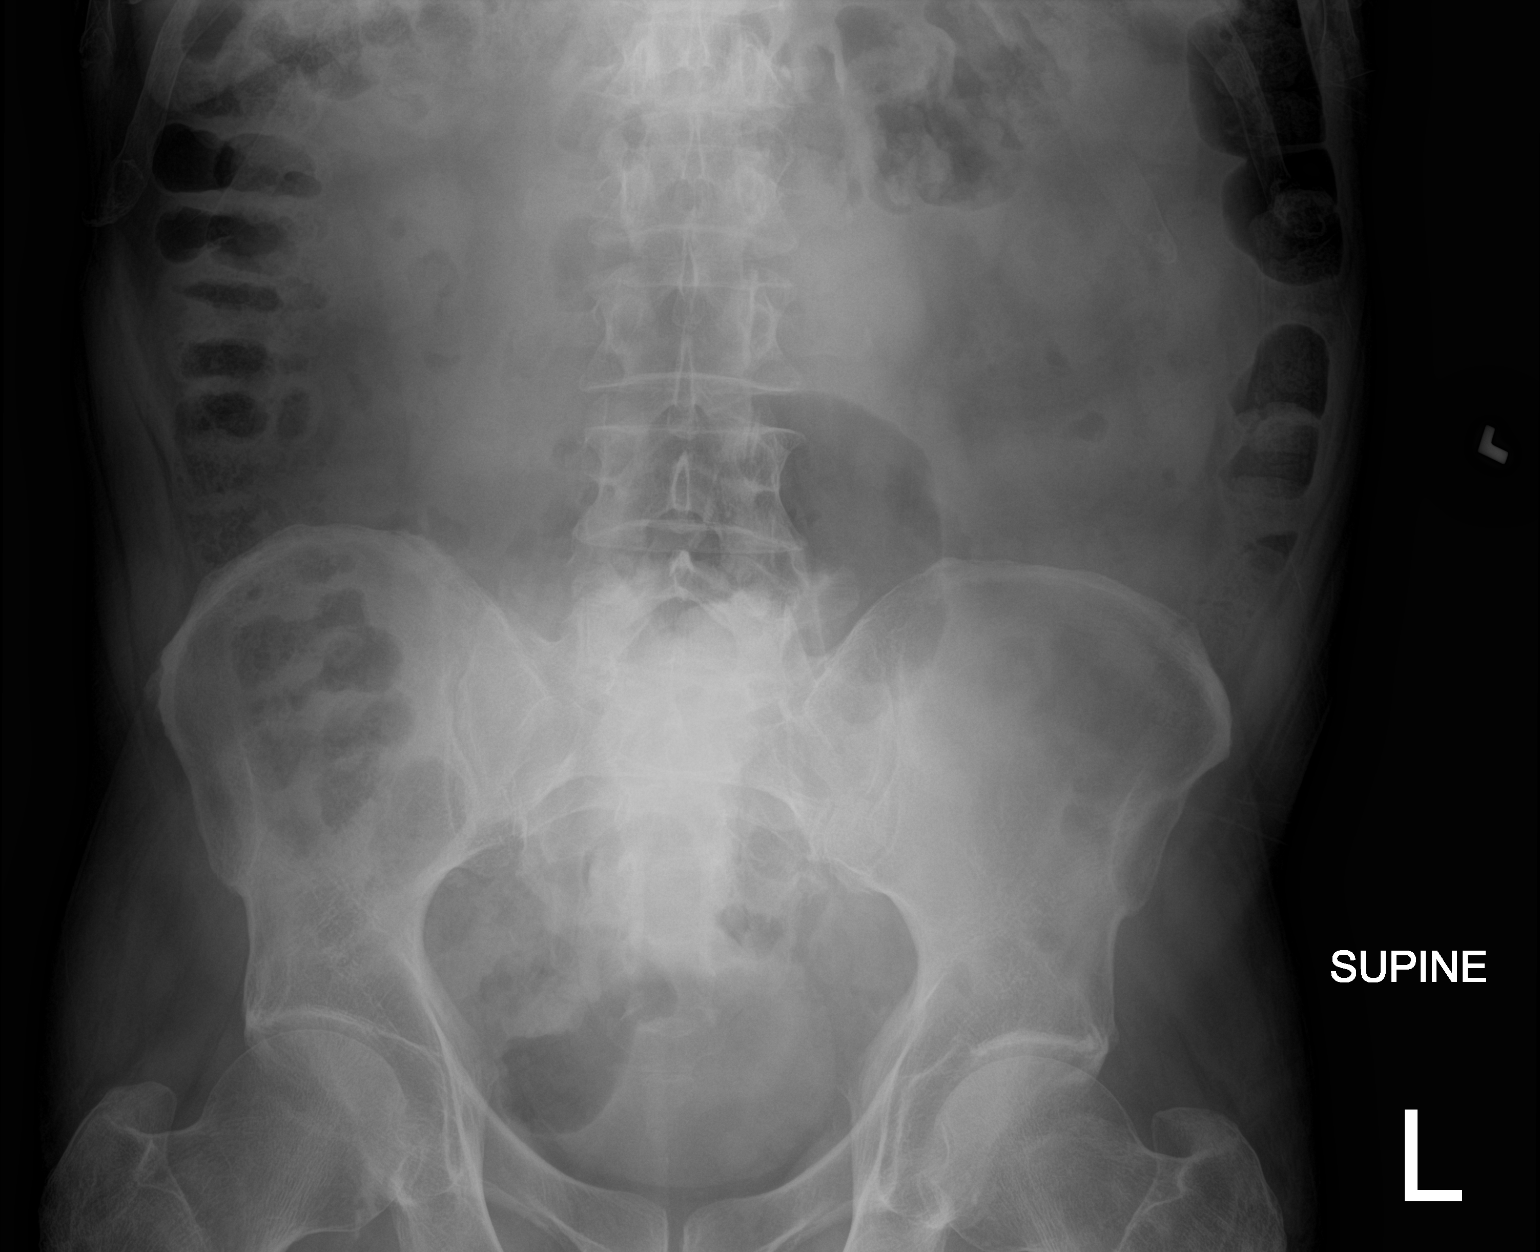

[2 of 2 positions shown; findings below may reference images not displayed]

FINDINGS: There is no evidence of pelvic fracture or diastasis. No pelvic bone
lesions are seen. No definite retained radiopaque foreign body.
There is a vague density overlying the sacrum with overlying
tubular-like lucency of unclear origin.
IMPRESSION: No definite retained radiopaque foreign body.

## 2024-11-03 ENCOUNTER — Other Ambulatory Visit (HOSPITAL_BASED_OUTPATIENT_CLINIC_OR_DEPARTMENT_OTHER): Payer: Self-pay

## 2024-11-03 MED ORDER — COMIRNATY 30 MCG/0.3ML IM SUSY
0.3000 mL | PREFILLED_SYRINGE | Freq: Once | INTRAMUSCULAR | 0 refills | Status: AC
  Filled 2024-11-03: qty 0.3, 1d supply, fill #0
